# Patient Record
Sex: Female | Born: 1995 | Race: Black or African American | Marital: Single | Smoking: Never smoker
Health system: Southern US, Community
[De-identification: ages and names within clinical notes are randomized; demographics above are authoritative.]

---
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
## 2017-03-04 ENCOUNTER — Other Ambulatory Visit: Payer: Self-pay | Admitting: Family Medicine

## 2017-03-04 ENCOUNTER — Encounter: Payer: Self-pay | Admitting: Family Medicine

## 2017-03-04 ENCOUNTER — Ambulatory Visit (INDEPENDENT_AMBULATORY_CARE_PROVIDER_SITE_OTHER): Payer: Managed Care, Other (non HMO) | Admitting: Family Medicine

## 2017-03-04 DIAGNOSIS — M25562 Pain in left knee: Secondary | ICD-10-CM | POA: Diagnosis not present

## 2017-03-04 NOTE — Progress Notes (Signed)
Patient presents today with symptoms of left knee pain. Patient states that at the end of February/early March she started to experience some pain in the lateral aspect of her knee. She denies any true incident/trauma. She states that at one point she remembers hopping off the court during practice due to the pain. She had some pain during the postseason but was able to play without much difficulty. She states that her pain is mostly with changing direction and jumping. She denies any significant swelling of the area or bruising. She does admit to having a lateral meniscectomy in the ninth grade.  ROS: Negative except mentioned above. Vitals as per Epic GENERAL: NAD MSK: Left knee - no significant effusion appreciated, tenderness is localized to the proximal aspect of the fibula, full range of motion, no lateral or medial joint line tenderness appreciated, no instability appreciated, negative McMurray, no significant laxity with varus or valgus stress, NV intact NEURO: CN II-XII grossly intact   A/P: Left knee pain - will do imaging of the area starting with x-ray, NSAIDs when necessary, will discuss activity level and treatment plan after imaging has been reviewed. Seek medical attention if any worsening or changing symptoms.

## 2017-03-16 ENCOUNTER — Ambulatory Visit
Admission: RE | Admit: 2017-03-16 | Discharge: 2017-03-16 | Disposition: A | Discharge status: Home / Self Care | Payer: PRIVATE HEALTH INSURANCE | Source: Ambulatory Visit | Attending: Family Medicine | Admitting: Family Medicine

## 2017-03-16 ENCOUNTER — Encounter: Payer: Self-pay | Admitting: Radiology

## 2017-03-16 DIAGNOSIS — M1712 Unilateral primary osteoarthritis, left knee: Secondary | ICD-10-CM | POA: Insufficient documentation

## 2017-03-16 DIAGNOSIS — M7052 Other bursitis of knee, left knee: Secondary | ICD-10-CM | POA: Insufficient documentation

## 2017-03-16 DIAGNOSIS — M25462 Effusion, left knee: Secondary | ICD-10-CM | POA: Diagnosis not present

## 2017-03-16 DIAGNOSIS — M25562 Pain in left knee: Secondary | ICD-10-CM | POA: Diagnosis not present

## 2017-03-19 ENCOUNTER — Ambulatory Visit (INDEPENDENT_AMBULATORY_CARE_PROVIDER_SITE_OTHER): Payer: 59 | Admitting: Family Medicine

## 2017-03-19 DIAGNOSIS — M25562 Pain in left knee: Secondary | ICD-10-CM

## 2017-03-19 NOTE — Progress Notes (Signed)
Still has some pain posterior laterally. Denies clicking, locking, swelling. Admits to having decreased symptoms with time and rest.  Reviewed MRI with athlete and Coach Smith. Will see Ardine Eng tomorrow. No impact activity for now. Can do upper body and cross-train if no symptoms.

## 2017-08-17 ENCOUNTER — Other Ambulatory Visit: Payer: Self-pay | Admitting: Family Medicine

## 2017-08-17 ENCOUNTER — Ambulatory Visit (INDEPENDENT_AMBULATORY_CARE_PROVIDER_SITE_OTHER): Payer: Managed Care, Other (non HMO) | Admitting: Family Medicine

## 2017-08-17 ENCOUNTER — Encounter: Payer: Self-pay | Admitting: Family Medicine

## 2017-08-17 ENCOUNTER — Ambulatory Visit
Admission: RE | Admit: 2017-08-17 | Discharge: 2017-08-17 | Disposition: A | Discharge status: Home / Self Care | Payer: Managed Care, Other (non HMO) | Source: Ambulatory Visit | Attending: Family Medicine | Admitting: Family Medicine

## 2017-08-17 DIAGNOSIS — M25562 Pain in left knee: Secondary | ICD-10-CM

## 2017-08-17 DIAGNOSIS — M25462 Effusion, left knee: Secondary | ICD-10-CM | POA: Diagnosis not present

## 2017-08-17 DIAGNOSIS — S8992XA Unspecified injury of left lower leg, initial encounter: Secondary | ICD-10-CM

## 2017-08-17 DIAGNOSIS — M21962 Unspecified acquired deformity of left lower leg: Secondary | ICD-10-CM | POA: Diagnosis not present

## 2017-08-17 MED ORDER — NAPROXEN 500 MG PO TABS: 500.0000 mg | ORAL_TABLET | Freq: Two times a day (BID) | ORAL | 0 refills | Status: AC

## 2017-08-17 NOTE — Progress Notes (Addendum)
Patient is an [employer] and presents with left knee pain after a non-contact injury yesterday. Patient has a hx of lateral meniscus injury in the past and OCD. She states most of her pain is lateral. She feels unstable when she walks. Denies any other injury. She has used ice on the area and has compression on it. She is on crutches today.   ROS: Negative except mentioned above.  GENERAL: NAD MSK: Left Knee - mild effusion, decreased flexion, tenderness along lateral jointline, +McMurray, -Lachman, -Drawer, no varus or valgus instability, nv intact NEURO: CN II-XII grossly intact   A/P: Left Knee Injury - given hx and exam need to evaluate for ligament/meniscal injury and status of OCD, Xrays done, MRI ordered, RICE, crutches, Naprosyn prn. Seek medical attention if any acute problems.

## 2017-08-24 ENCOUNTER — Ambulatory Visit (INDEPENDENT_AMBULATORY_CARE_PROVIDER_SITE_OTHER): Payer: Managed Care, Other (non HMO) | Admitting: Family Medicine

## 2017-08-24 DIAGNOSIS — S83282A Other tear of lateral meniscus, current injury, left knee, initial encounter: Secondary | ICD-10-CM

## 2017-08-24 NOTE — Progress Notes (Signed)
Patient here to review her MRI results. Patient states that her knee has been feeling better over the last few days. She states that the swelling has gone down. She is walking without any pain. The anti-inflammatory medication that I prescribed her seems to be helping her. Findings regarding the MRI were discussed with the patient. Patient addresses understanding of chondral defect and lateral meniscal changes. Patient states that she would like to do some physical activity with the brace on today to see how her knee feels. She understands the option to scope her knee to better look at the above problems. She will meet with Dr. Ardine Eng next week and make her decision. Patient appreciative and all questions answered.

## 2018-09-15 ENCOUNTER — Ambulatory Visit (INDEPENDENT_AMBULATORY_CARE_PROVIDER_SITE_OTHER): Payer: Managed Care, Other (non HMO) | Admitting: Family Medicine

## 2018-09-15 ENCOUNTER — Encounter: Payer: Self-pay | Admitting: Family Medicine

## 2018-09-15 VITALS — BP 117/67 | HR 48 | Temp 97.7°F | Resp 14

## 2018-09-15 DIAGNOSIS — R0981 Nasal congestion: Secondary | ICD-10-CM

## 2018-09-15 DIAGNOSIS — J069 Acute upper respiratory infection, unspecified: Secondary | ICD-10-CM

## 2018-09-15 MED ORDER — AZITHROMYCIN 250 MG PO TABS: ORAL_TABLET | ORAL | 0 refills | Status: AC

## 2018-09-15 NOTE — Progress Notes (Signed)
Today with symptoms of cough and nasal congestion.  Patient states that she has had the symptoms for several weeks.  She admits that the mucus is yellow.  She denies any fever, chills, shortness of breath, chest pain, night sweats, weight loss, severe headache.  She denies any history of asthma.  She has had no difficulty with doing physical activity such as running with basketball.  She does have seasonal allergies but usually in the spring.  She has tried NyQuil and DayQuil for her symptoms.  She denies any possible new environmental allergens.  She denies taking any new medications recently.  ROS: Negative except mentioned above. Vitals as per Epic. GENERAL: NAD HEENT: no pharyngeal erythema, no exudate, no erythema of TMs, no cervical LAD RESP: CTA B CARD: RRR NEURO: CN II-XII grossly intact   A/P: Persistent cough, nasal congestion -given colored mucus and length of time will prescribe patient Z-Pak, recommend patient take Claritin and Flonase for at least a week to 10 days to see if any improvement of symptoms, encouraged patient to look for any particular possible allergens contributing to symptoms, can try Delsym for cough if needed, if symptoms are still persistent or are worse after 2 weeks would recommend doing blood work and chest x-ray, patient addresses understanding, no athletic activity or class if febrile, seek medical attention as discussed, will inform trainer of above.

## 2018-12-28 ENCOUNTER — Other Ambulatory Visit: Payer: Self-pay | Admitting: Family Medicine

## 2018-12-28 MED ORDER — DICLOFENAC SODIUM 75 MG PO TBEC: 75.0000 mg | DELAYED_RELEASE_TABLET | Freq: Two times a day (BID) | ORAL | 0 refills | Status: DC

## 2019-02-07 ENCOUNTER — Other Ambulatory Visit: Payer: Self-pay | Admitting: Family Medicine

## 2019-02-07 MED ORDER — DICLOFENAC SODIUM 75 MG PO TBEC: 75.0000 mg | DELAYED_RELEASE_TABLET | Freq: Two times a day (BID) | ORAL | 0 refills | Status: DC

## 2019-05-03 ENCOUNTER — Other Ambulatory Visit: Payer: Self-pay | Admitting: *Deleted

## 2019-05-03 DIAGNOSIS — Z20822 Contact with and (suspected) exposure to covid-19: Secondary | ICD-10-CM

## 2019-05-04 ENCOUNTER — Other Ambulatory Visit: Payer: Managed Care, Other (non HMO)

## 2019-05-05 ENCOUNTER — Other Ambulatory Visit: Payer: Self-pay

## 2019-05-05 DIAGNOSIS — Z20822 Contact with and (suspected) exposure to covid-19: Secondary | ICD-10-CM

## 2019-05-07 LAB — NOVEL CORONAVIRUS, NAA: SARS-CoV-2, NAA: NOT DETECTED

## 2019-06-09 ENCOUNTER — Other Ambulatory Visit: Payer: Self-pay | Admitting: *Deleted

## 2019-06-09 DIAGNOSIS — Z20822 Contact with and (suspected) exposure to covid-19: Secondary | ICD-10-CM

## 2019-06-13 ENCOUNTER — Other Ambulatory Visit: Payer: Self-pay

## 2019-06-13 DIAGNOSIS — Z20822 Contact with and (suspected) exposure to covid-19: Secondary | ICD-10-CM

## 2019-06-18 LAB — NOVEL CORONAVIRUS, NAA: SARS-CoV-2, NAA: NOT DETECTED

## 2019-06-21 ENCOUNTER — Other Ambulatory Visit: Payer: Self-pay | Admitting: Family Medicine

## 2019-06-23 LAB — 5+CRT-BUND
Amphetamines, Urine: NEGATIVE ng/mL
Cannabinoid Quant, Ur: NEGATIVE ng/mL
Cocaine (Metab.): NEGATIVE ng/mL
Creatinine, Urine: 20.7 mg/dL (ref 20.0–300.0)
OPIATE QUANTITATIVE URINE: NEGATIVE ng/mL
PCP Quant, Ur: NEGATIVE ng/mL
pH, Urine: 6.2 (ref 4.5–8.9)

## 2019-08-29 ENCOUNTER — Other Ambulatory Visit: Payer: Self-pay | Admitting: *Deleted

## 2019-08-29 DIAGNOSIS — Z20822 Contact with and (suspected) exposure to covid-19: Secondary | ICD-10-CM

## 2019-08-30 LAB — NOVEL CORONAVIRUS, NAA: SARS-CoV-2, NAA: NOT DETECTED

## 2019-09-22 ENCOUNTER — Other Ambulatory Visit: Payer: Self-pay

## 2019-09-22 ENCOUNTER — Ambulatory Visit
Admission: RE | Admit: 2019-09-22 | Discharge: 2019-09-22 | Disposition: A | Discharge status: Home / Self Care | Payer: Managed Care, Other (non HMO) | Source: Ambulatory Visit | Attending: Family Medicine | Admitting: Family Medicine

## 2019-09-22 ENCOUNTER — Other Ambulatory Visit: Payer: Self-pay | Admitting: Family Medicine

## 2019-09-22 DIAGNOSIS — M25571 Pain in right ankle and joints of right foot: Secondary | ICD-10-CM | POA: Diagnosis not present

## 2019-09-22 NOTE — Progress Notes (Signed)
Right ankle Xray

## 2019-11-06 ENCOUNTER — Other Ambulatory Visit: Payer: Self-pay | Admitting: Family Medicine

## 2019-11-06 MED ORDER — DICLOFENAC SODIUM 75 MG PO TBEC: DELAYED_RELEASE_TABLET | ORAL | 1 refills | Status: DC

## 2019-11-20 ENCOUNTER — Ambulatory Visit
Admission: RE | Admit: 2019-11-20 | Discharge: 2019-11-20 | Disposition: A | Discharge status: Home / Self Care | Payer: Managed Care, Other (non HMO) | Attending: Family Medicine | Admitting: Family Medicine

## 2019-11-20 ENCOUNTER — Other Ambulatory Visit: Payer: Self-pay | Admitting: Family Medicine

## 2019-11-20 ENCOUNTER — Other Ambulatory Visit: Payer: Self-pay

## 2019-11-20 ENCOUNTER — Ambulatory Visit
Admission: RE | Admit: 2019-11-20 | Discharge: 2019-11-20 | Disposition: A | Discharge status: Home / Self Care | Payer: Managed Care, Other (non HMO) | Source: Ambulatory Visit | Attending: Family Medicine | Admitting: Family Medicine

## 2019-11-20 DIAGNOSIS — S59802A Other specified injuries of left elbow, initial encounter: Secondary | ICD-10-CM

## 2019-11-23 ENCOUNTER — Telehealth: Payer: Self-pay | Admitting: Family Medicine

## 2019-11-23 NOTE — Progress Notes (Signed)
Patient landed with outstretched left hand during a basketball game this past weekend. Was assessed right after the injury. Had medial elbow pain and decreased ROM. Denies any previous injury to the left elbow in the past. Denies any wrist or hand pain.  Has started to notice swelling and ecchymosis in the proximal flexor muscle group after 24-48 hours. Has been using ice and NSAIDs. ROM is still mildly limited.  Xrays were done and showed soft tissue swelling but no acute fracture.  ROS: Negative except mentioned above. Exam during the game, after the injury: GENERAL: mild discomfort  MSK: Left Elbow- mild to moderate tenderness along medial epicondyle, ROM limited with extension, no significant laxity appreciated but patient guarding, nv intact, no wrist pain specifically at snuff box NEURO: CN II-XII grossly intact   A/P: L Elbow Injury - recommend Xray, Game [employer] prn, has Voltaren she is taking twice daily, sling if needed, follow-up with Dr. Gerald Dexter as soon as possible, will order further imaging to evaluate for ligament injury, bone bruise, etc if Xrays are negative and patient still symptomatic. Can do lower extremity activity if tolerated. Discussed plan with athletic trainer.

## 2019-11-24 ENCOUNTER — Ambulatory Visit: Payer: PRIVATE HEALTH INSURANCE

## 2019-11-24 ENCOUNTER — Ambulatory Visit
Admission: RE | Admit: 2019-11-24 | Discharge: 2019-11-24 | Disposition: A | Discharge status: Home / Self Care | Payer: Managed Care, Other (non HMO) | Source: Ambulatory Visit | Attending: Family Medicine | Admitting: Family Medicine

## 2019-11-24 ENCOUNTER — Other Ambulatory Visit: Payer: Self-pay

## 2019-11-24 DIAGNOSIS — S59802A Other specified injuries of left elbow, initial encounter: Secondary | ICD-10-CM | POA: Diagnosis present

## 2019-12-29 ENCOUNTER — Other Ambulatory Visit: Payer: Self-pay | Admitting: Family Medicine

## 2019-12-29 MED ORDER — DICLOFENAC SODIUM 75 MG PO TBEC: DELAYED_RELEASE_TABLET | ORAL | 1 refills | Status: AC

## 2021-04-28 IMAGING — MR MR ELBOW*L* W/O CM
6 series · 40 of 40 positions shown · non-contrast
Comparison: X-ray 11/20/2019

CLINICAL DATA: Medial left elbow pain and swelling after fall

EXAM:
MRI OF THE LEFT ELBOW WITHOUT CONTRAST
TECHNIQUE: Multiplanar, multisequence MR imaging of the elbow was performed. No
intravenous contrast was administered.

[Series 3: T1 · axial · left · 3.0mm · 0.47mm/px · z∈[-21,+74]mm · 8 of 25 slices shown (1 of 2)]
[im 1/25]
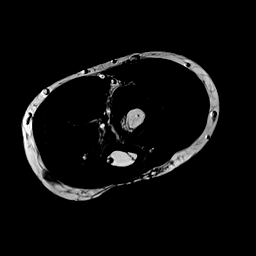
[im 4/25]
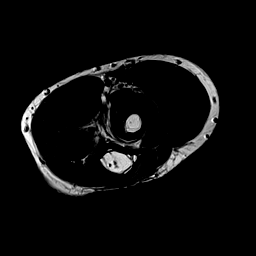
[im 7/25]
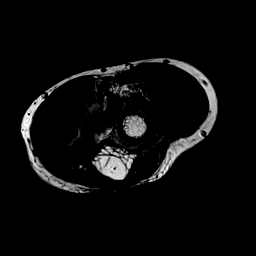
[im 11/25]
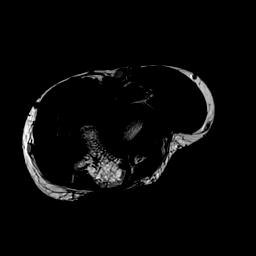
[im 14/25]
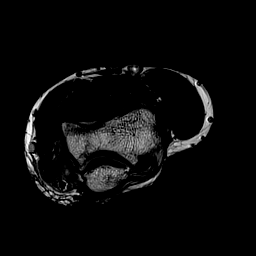
[im 18/25]
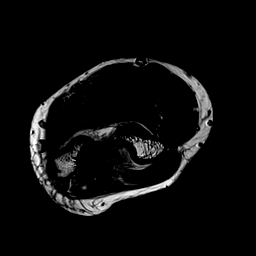
[im 21/25]
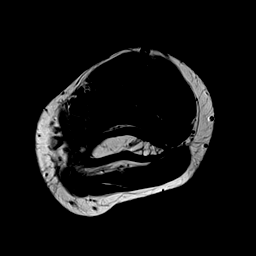
[im 25/25]
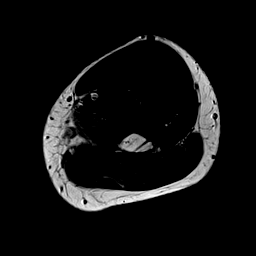

[Series 8: T1 · coronal · left · 3.0mm · 0.55mm/px · 6 of 20 slices shown (2 of 2)]
[im 1/20]
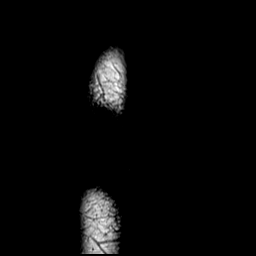
[im 4/20]
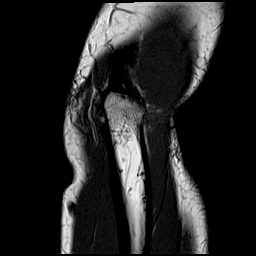
[im 8/20]
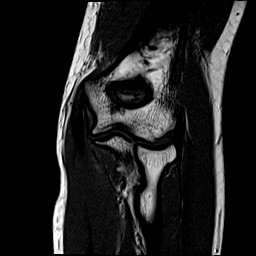
[im 12/20]
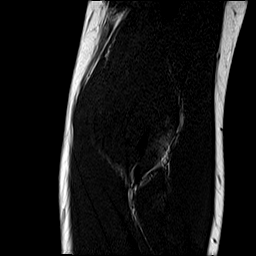
[im 16/20]
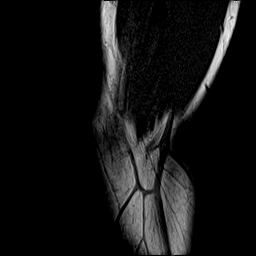
[im 20/20]
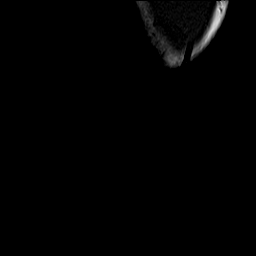

[Series 1011: T2 · axial · left · 3.0mm · 0.47mm/px · z∈[-27,+69]mm · 7 of 25 slices shown (1 of 2)]
[im 1/25]
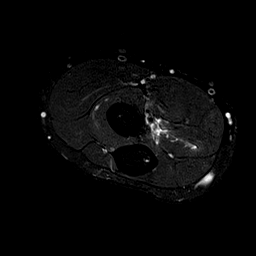
[im 5/25]
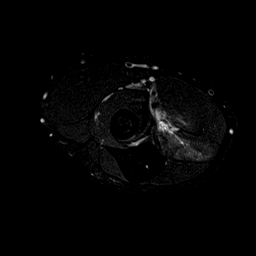
[im 9/25]
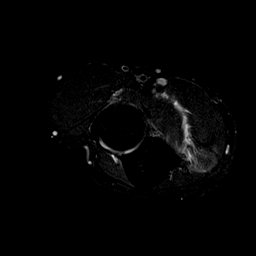
[im 13/25]
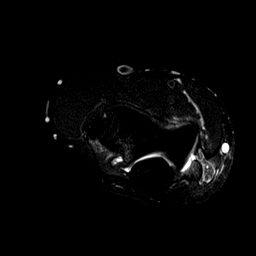
[im 17/25]
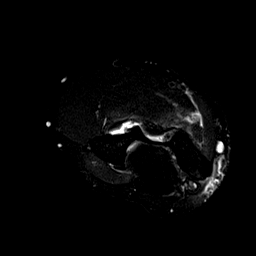
[im 21/25]
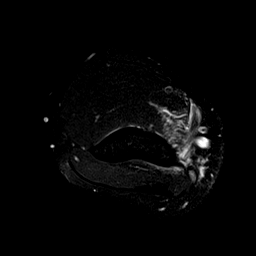
[im 25/25]
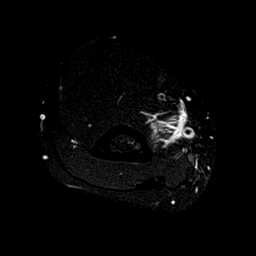

[Series 1018: T2 · coronal · left · 3.0mm · 0.55mm/px · 6 of 19 slices shown (2 of 2)]
[im 1/19]
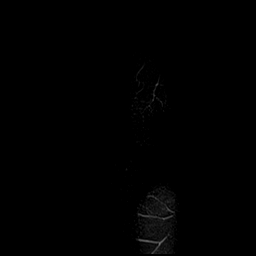
[im 4/19]
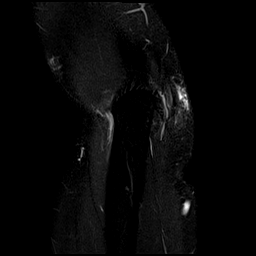
[im 8/19]
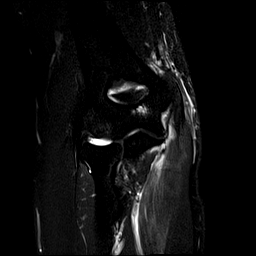
[im 11/19]
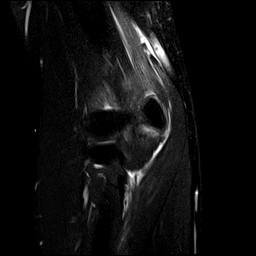
[im 15/19]
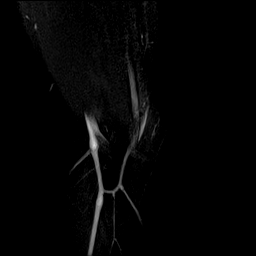
[im 19/19]
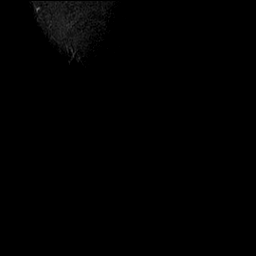

[Series 1025: cor t1_new · coronal · left · 3.0mm · 0.55mm/px · 6 of 20 slices shown]
[im 1/20]
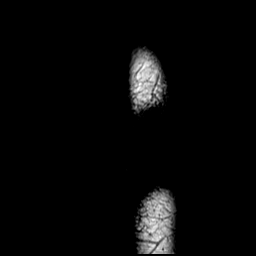
[im 4/20]
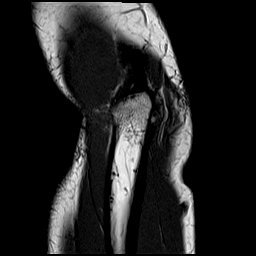
[im 8/20]
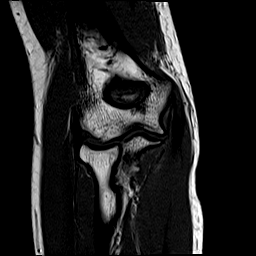
[im 12/20]
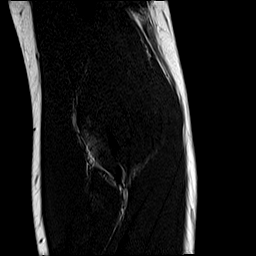
[im 16/20]
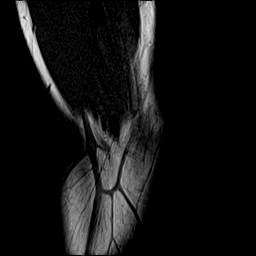
[im 20/20]
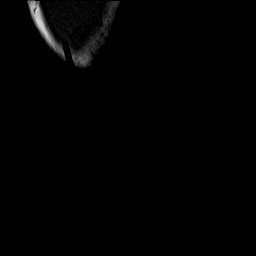

[Series 1032: ax t1_new · axial · left · 3.0mm · 0.47mm/px · z∈[-27,+69]mm · 7 of 25 slices shown]
[im 1/25]
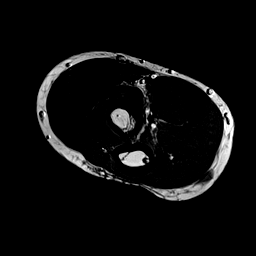
[im 5/25]
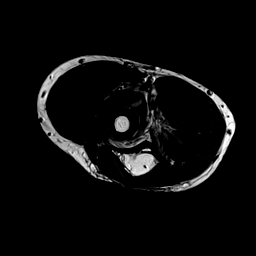
[im 9/25]
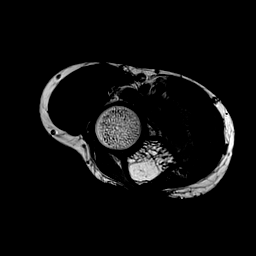
[im 13/25]
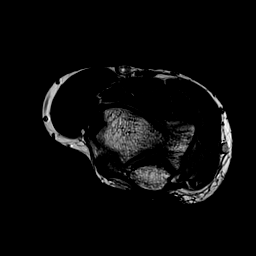
[im 17/25]
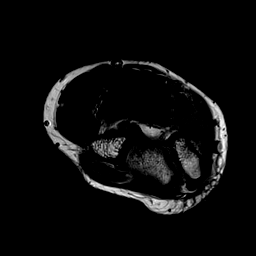
[im 21/25]
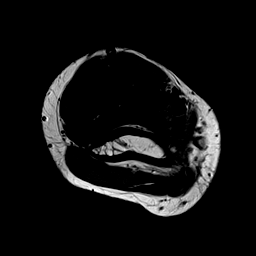
[im 25/25]
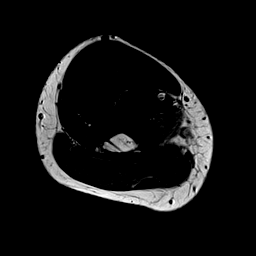

[40 of 40 positions shown; findings below may reference images not displayed]

FINDINGS: TENDONS

Common forearm flexor origin: Avulsion tear of the flexor digitorum
superficialis tendon with extensive intramuscular edema. The
remaining forearm flexor origins appear grossly intact.

Common forearm extensor origin: Intact.

Biceps: Intact.

Triceps: Intact.

LIGAMENTS

Medial stabilizers: High-grade avulsion tear of the anterior band of
the ulnar collateral ligament from the origin on the medial humeral
epicondyle (sagittal image 14).

Lateral stabilizers:  Intact.

Cartilage: Intact.

Joint: Trace joint effusion. No dislocation.

Cubital tunnel: Edema adjacent to the cubital tunnel without
evidence of nerve entrapment.

Bones: Marrow edema within the medial epicondyle at site of UCL
avulsion no avulsion fragment is identified.
IMPRESSION: 1. High-grade near complete avulsion tear of the ulnar collateral
ligament from the origin on the medial humeral epicondyle.
2. Avulsion tear of the flexor digitorum superficialis tendon
origin.
